# Patient Record
Sex: Female | Born: 1965 | Race: White | Hispanic: No | Marital: Single
Health system: Southern US, Community
[De-identification: ages and names within clinical notes are randomized; demographics above are authoritative.]

---

## 2019-06-13 ENCOUNTER — Other Ambulatory Visit: Payer: Self-pay

## 2019-06-13 ENCOUNTER — Emergency Department: Payer: Self-pay

## 2019-06-13 ENCOUNTER — Emergency Department
Admission: EM | Admit: 2019-06-13 | Discharge: 2019-06-13 | Disposition: A | Discharge status: Home / Self Care | Payer: Self-pay | Attending: Emergency Medicine | Admitting: Emergency Medicine

## 2019-06-13 DIAGNOSIS — Y929 Unspecified place or not applicable: Secondary | ICD-10-CM | POA: Insufficient documentation

## 2019-06-13 DIAGNOSIS — S0083XA Contusion of other part of head, initial encounter: Secondary | ICD-10-CM | POA: Insufficient documentation

## 2019-06-13 DIAGNOSIS — Y9389 Activity, other specified: Secondary | ICD-10-CM | POA: Insufficient documentation

## 2019-06-13 DIAGNOSIS — S0990XA Unspecified injury of head, initial encounter: Secondary | ICD-10-CM

## 2019-06-13 DIAGNOSIS — Y999 Unspecified external cause status: Secondary | ICD-10-CM | POA: Insufficient documentation

## 2019-06-13 DIAGNOSIS — T148XXA Other injury of unspecified body region, initial encounter: Secondary | ICD-10-CM

## 2019-06-13 DIAGNOSIS — R4182 Altered mental status, unspecified: Secondary | ICD-10-CM | POA: Insufficient documentation
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
## 2019-06-13 NOTE — Discharge Instructions (Addendum)
Please seek medical attention for any high fevers, chest pain, shortness of breath, change in behavior, persistent vomiting, bloody stool or any other new or concerning symptoms.  
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
## 2019-06-13 NOTE — ED Notes (Signed)
Pt unable to sign for discharge paperwork d/t AMS.

## 2021-01-11 IMAGING — CT CT HEAD W/O CM
3 series · 15 of 44 positions shown, 18 images · non-contrast
Comparison: None.

CLINICAL DATA: Altered level of consciousness, frontal scalp
contusion

EXAM:
CT HEAD WITHOUT CONTRAST
TECHNIQUE: Contiguous axial images were obtained from the base of the skull
through the vertex without intravenous contrast.

[Series 2: head wo · axial · 0.40mm/px · z∈[-159,-49]mm · 9 of 27 slices shown, 12 images]
[im 3/27  brain]
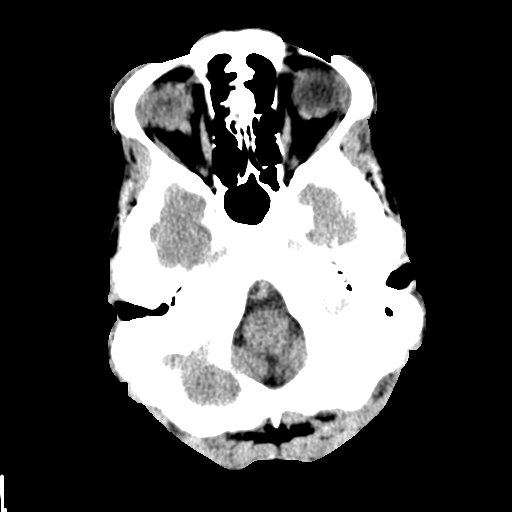
[im 3/27  bone]
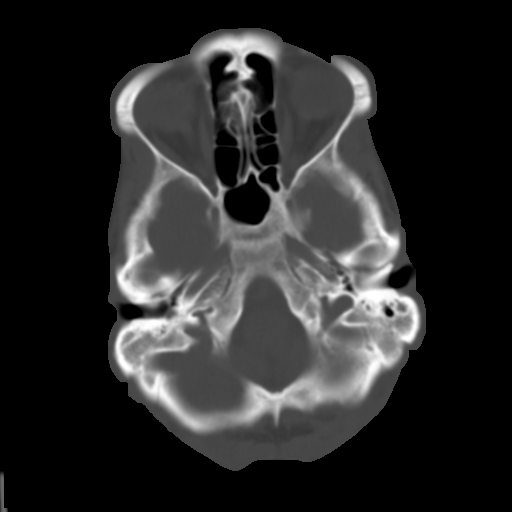
[im 6/27  brain]
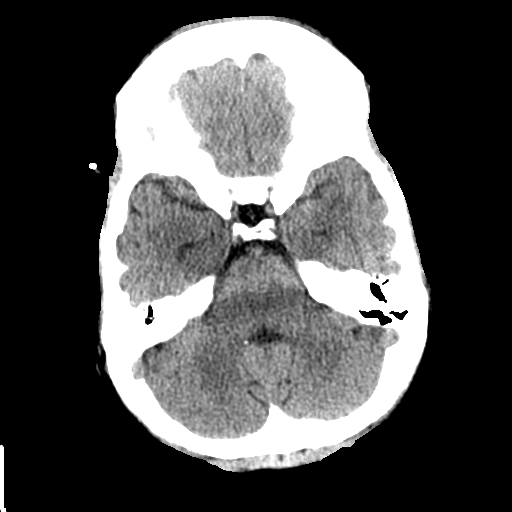
[im 8/27  brain]
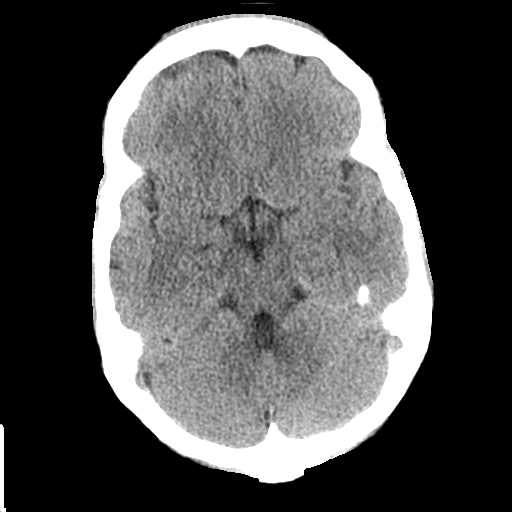
[im 11/27  brain]
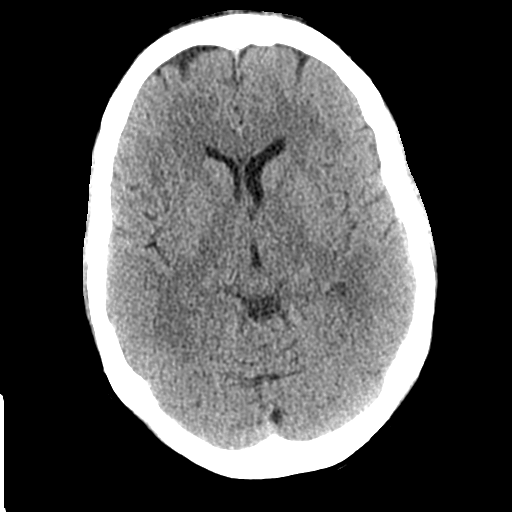
[im 14/27  brain]
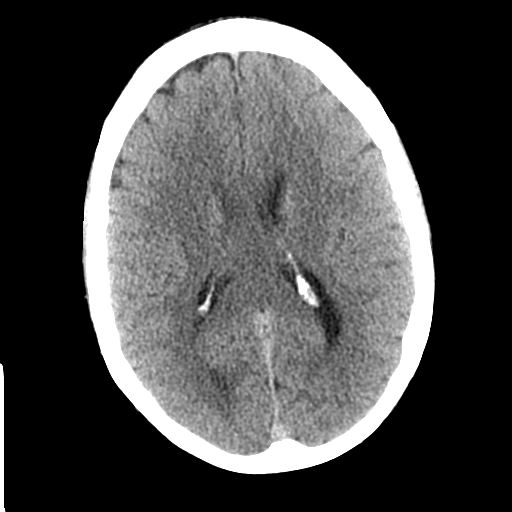
[im 14/27  bone]
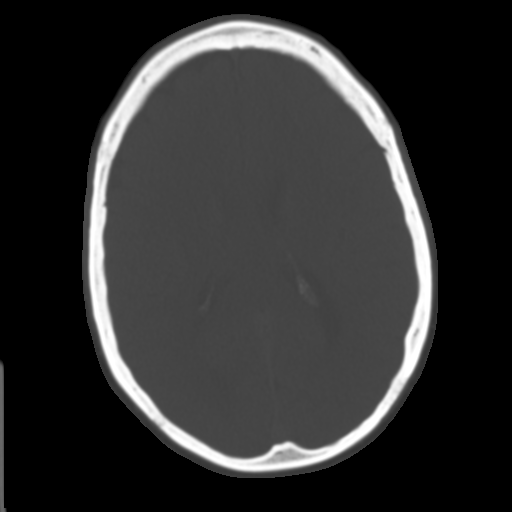
[im 17/27  brain]
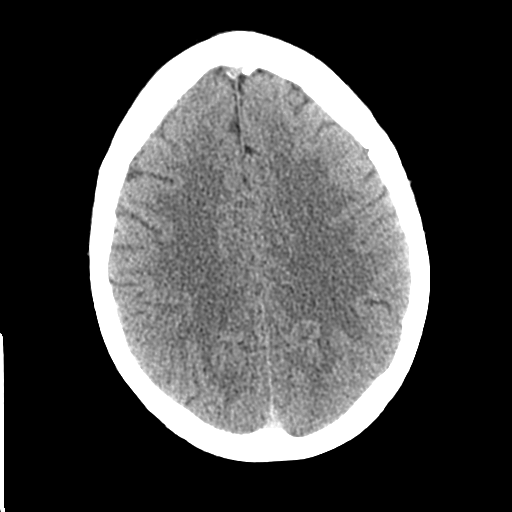
[im 20/27  brain]
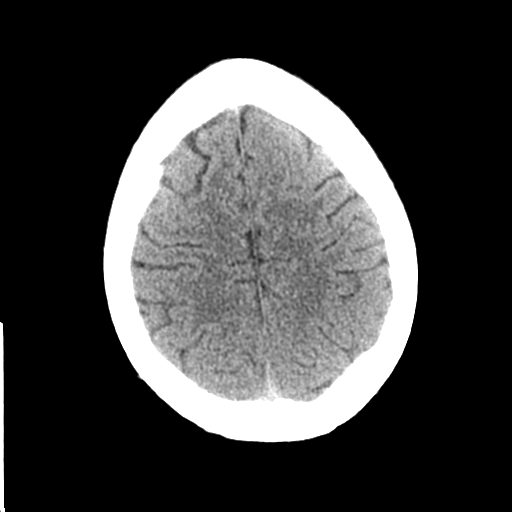
[im 22/27  brain]
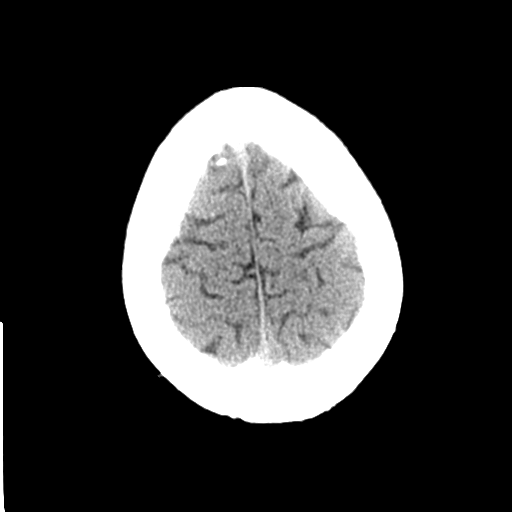
[im 25/27  brain]
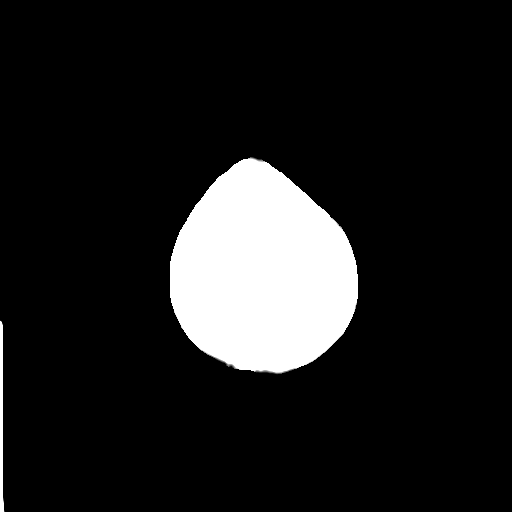
[im 25/27  bone]
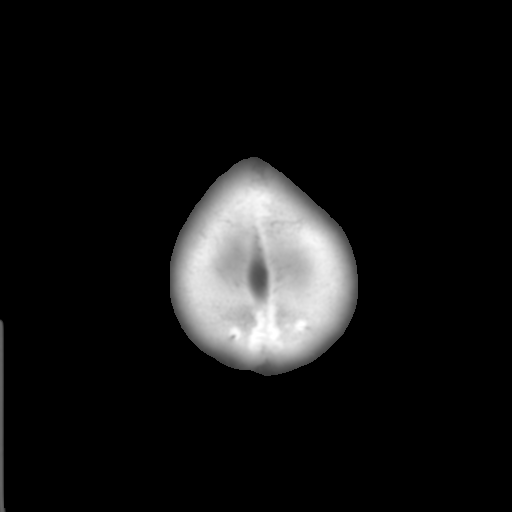

[Series 4: coronal soft tissue · coronal · 0.29mm/px · 3 of 65 slices shown]
[im 22/65  brain]
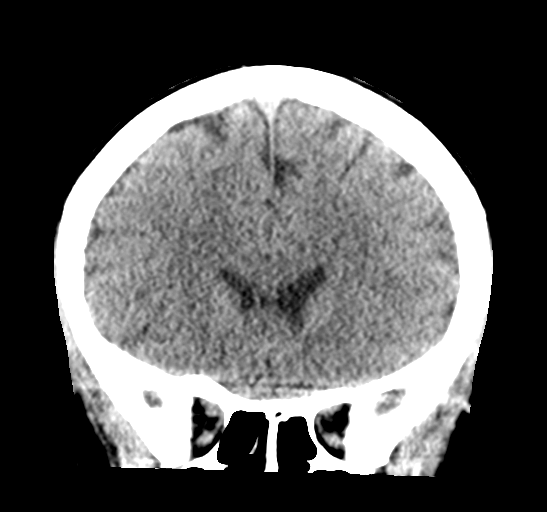
[im 29/65  brain]
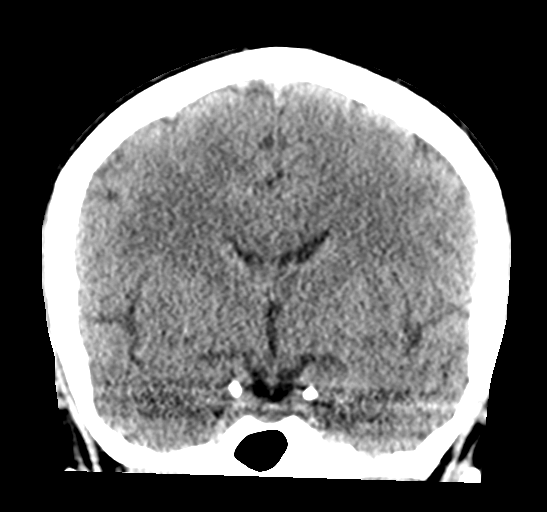
[im 36/65  brain]
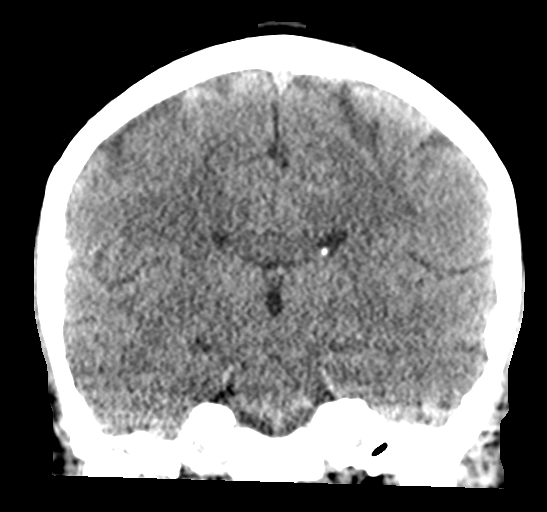

[Series 5: sagittal soft tissue · sagittal · 0.29mm/px · 3 of 50 slices shown]
[im 17/50  brain]
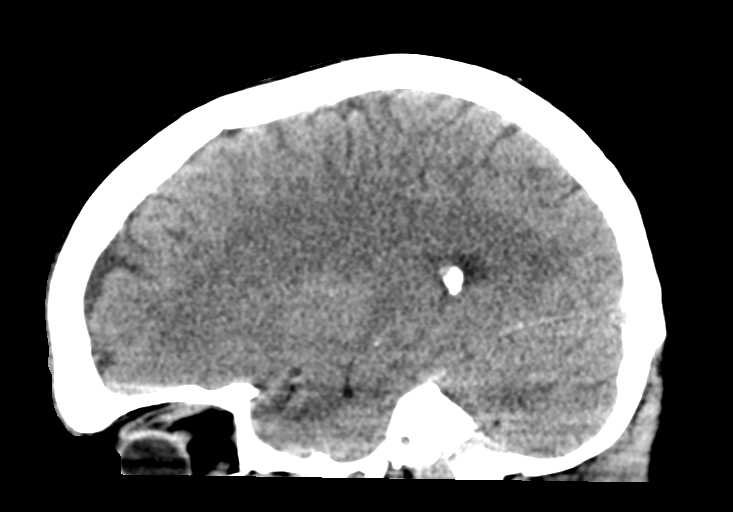
[im 25/50  brain]
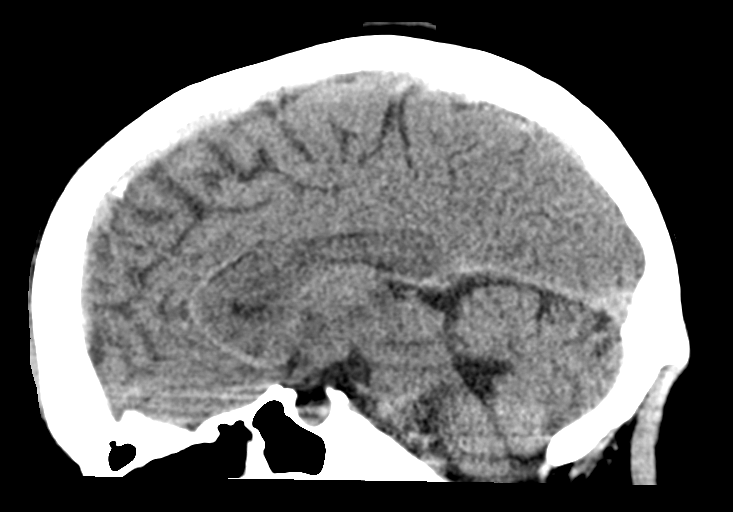
[im 33/50  brain]
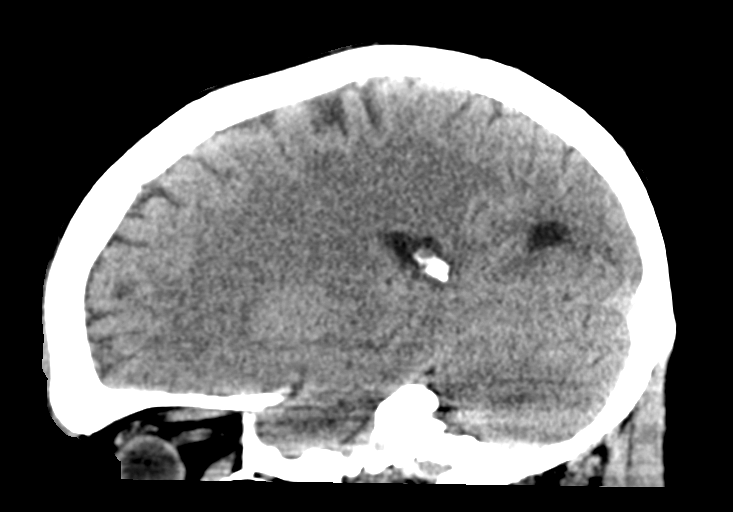

[15 of 44 positions shown; findings below may reference images not displayed]

FINDINGS: Brain: No acute infarct or hemorrhage. Lateral ventricles and
midline structures are unremarkable. No acute extra-axial fluid
collections. No mass effect.

Vascular: No hyperdense vessel or unexpected calcification.

Skull: Small midline frontal scalp hematoma. No underlying
fractures.

Sinuses/Orbits: No acute finding.

Other: None
IMPRESSION: 1. Small midline frontal scalp hematoma. No underlying fractures.
2. No acute intracranial process.
